# Patient Record
Sex: Female | Born: 2000 | Race: Black or African American | Hispanic: No | Marital: Single | Smoking: Never smoker
Health system: Southern US, Community
[De-identification: ages and names within clinical notes are randomized; demographics above are authoritative.]

## PROBLEM LIST (undated history)

## (undated) HISTORY — PX: TONSILLECTOMY: SUR1361

## (undated) HISTORY — PX: APPENDECTOMY: SHX54

## (undated) HISTORY — PX: NO PAST SURGERIES: SHX2092

---
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
## 2021-03-06 DIAGNOSIS — R300 Dysuria: Secondary | ICD-10-CM | POA: Diagnosis not present

## 2021-03-07 DIAGNOSIS — Z113 Encounter for screening for infections with a predominantly sexual mode of transmission: Secondary | ICD-10-CM | POA: Diagnosis not present

## 2021-03-07 DIAGNOSIS — J029 Acute pharyngitis, unspecified: Secondary | ICD-10-CM | POA: Diagnosis not present

## 2021-03-07 DIAGNOSIS — R300 Dysuria: Secondary | ICD-10-CM | POA: Diagnosis not present

## 2021-03-20 DIAGNOSIS — Z113 Encounter for screening for infections with a predominantly sexual mode of transmission: Secondary | ICD-10-CM | POA: Diagnosis not present

## 2021-03-20 DIAGNOSIS — A749 Chlamydial infection, unspecified: Secondary | ICD-10-CM | POA: Diagnosis not present

## 2021-04-22 DIAGNOSIS — Z113 Encounter for screening for infections with a predominantly sexual mode of transmission: Secondary | ICD-10-CM | POA: Diagnosis not present

## 2021-06-26 DIAGNOSIS — Z202 Contact with and (suspected) exposure to infections with a predominantly sexual mode of transmission: Secondary | ICD-10-CM | POA: Diagnosis not present

## 2021-06-26 DIAGNOSIS — Z719 Counseling, unspecified: Secondary | ICD-10-CM | POA: Diagnosis not present

## 2022-01-06 ENCOUNTER — Encounter (HOSPITAL_COMMUNITY): Payer: Self-pay | Admitting: Emergency Medicine

## 2022-01-06 ENCOUNTER — Emergency Department (HOSPITAL_COMMUNITY)
Admission: EM | Admit: 2022-01-06 | Discharge: 2022-01-06 | Discharge status: Against Medical Advice | Payer: Medicaid Other | Attending: Emergency Medicine | Admitting: Emergency Medicine

## 2022-01-06 DIAGNOSIS — R519 Headache, unspecified: Secondary | ICD-10-CM | POA: Insufficient documentation

## 2022-01-06 DIAGNOSIS — M79602 Pain in left arm: Secondary | ICD-10-CM | POA: Insufficient documentation

## 2022-01-06 DIAGNOSIS — Y990 Civilian activity done for income or pay: Secondary | ICD-10-CM | POA: Insufficient documentation

## 2022-01-06 DIAGNOSIS — Z5321 Procedure and treatment not carried out due to patient leaving prior to being seen by health care provider: Secondary | ICD-10-CM | POA: Diagnosis not present

## 2022-01-06 DIAGNOSIS — M542 Cervicalgia: Secondary | ICD-10-CM | POA: Insufficient documentation

## 2022-01-06 NOTE — ED Notes (Signed)
Visitor stated that pt work notified pt to go to another facility to be evaluated. Pt seen leaving ED.

## 2022-01-06 NOTE — ED Triage Notes (Signed)
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 Pt reports headache and left arm and neck pain.

## 2022-03-14 DIAGNOSIS — R8290 Unspecified abnormal findings in urine: Secondary | ICD-10-CM | POA: Diagnosis not present

## 2023-02-02 ENCOUNTER — Emergency Department (HOSPITAL_COMMUNITY): Payer: Medicaid Other

## 2023-02-02 ENCOUNTER — Encounter (HOSPITAL_COMMUNITY): Payer: Self-pay

## 2023-02-02 ENCOUNTER — Emergency Department (HOSPITAL_COMMUNITY)
Admission: EM | Admit: 2023-02-02 | Discharge: 2023-02-02 | Disposition: A | Discharge status: Home / Self Care | Payer: Medicaid Other | Attending: Emergency Medicine | Admitting: Emergency Medicine

## 2023-02-02 ENCOUNTER — Other Ambulatory Visit: Payer: Self-pay

## 2023-02-02 DIAGNOSIS — Y990 Civilian activity done for income or pay: Secondary | ICD-10-CM | POA: Insufficient documentation

## 2023-02-02 DIAGNOSIS — S46912A Strain of unspecified muscle, fascia and tendon at shoulder and upper arm level, left arm, initial encounter: Secondary | ICD-10-CM | POA: Diagnosis not present

## 2023-02-02 DIAGNOSIS — R200 Anesthesia of skin: Secondary | ICD-10-CM | POA: Diagnosis not present

## 2023-02-02 DIAGNOSIS — M7542 Impingement syndrome of left shoulder: Secondary | ICD-10-CM | POA: Insufficient documentation

## 2023-02-02 DIAGNOSIS — G5682 Other specified mononeuropathies of left upper limb: Secondary | ICD-10-CM

## 2023-02-02 DIAGNOSIS — R29818 Other symptoms and signs involving the nervous system: Secondary | ICD-10-CM | POA: Diagnosis not present

## 2023-02-02 DIAGNOSIS — M5412 Radiculopathy, cervical region: Secondary | ICD-10-CM | POA: Diagnosis not present

## 2023-02-02 DIAGNOSIS — M4312 Spondylolisthesis, cervical region: Secondary | ICD-10-CM | POA: Diagnosis not present

## 2023-02-02 DIAGNOSIS — X500XXA Overexertion from strenuous movement or load, initial encounter: Secondary | ICD-10-CM | POA: Diagnosis not present

## 2023-02-02 DIAGNOSIS — S4992XA Unspecified injury of left shoulder and upper arm, initial encounter: Secondary | ICD-10-CM | POA: Diagnosis present

## 2023-02-02 DIAGNOSIS — R531 Weakness: Secondary | ICD-10-CM | POA: Insufficient documentation

## 2023-02-02 LAB — CBC WITH DIFFERENTIAL/PLATELET
Abs Immature Granulocytes: 0.01 10*3/uL (ref 0.00–0.07)
Basophils Absolute: 0 10*3/uL (ref 0.0–0.1)
Basophils Relative: 1 %
Eosinophils Absolute: 0.1 10*3/uL (ref 0.0–0.5)
Eosinophils Relative: 1 %
HCT: 36 % (ref 36.0–46.0)
Hemoglobin: 10.9 g/dL — ABNORMAL LOW (ref 12.0–15.0)
Immature Granulocytes: 0 %
Lymphocytes Relative: 33 %
Lymphs Abs: 1.7 10*3/uL (ref 0.7–4.0)
MCH: 24.4 pg — ABNORMAL LOW (ref 26.0–34.0)
MCHC: 30.3 g/dL (ref 30.0–36.0)
MCV: 80.5 fL (ref 80.0–100.0)
Monocytes Absolute: 0.7 10*3/uL (ref 0.1–1.0)
Monocytes Relative: 13 %
Neutro Abs: 2.6 10*3/uL (ref 1.7–7.7)
Neutrophils Relative %: 52 %
Platelets: 289 10*3/uL (ref 150–400)
RBC: 4.47 MIL/uL (ref 3.87–5.11)
RDW: 15.1 % (ref 11.5–15.5)
WBC: 5 10*3/uL (ref 4.0–10.5)
nRBC: 0 % (ref 0.0–0.2)

## 2023-02-02 LAB — BASIC METABOLIC PANEL
Anion gap: 7 (ref 5–15)
BUN: 8 mg/dL (ref 6–20)
CO2: 24 mmol/L (ref 22–32)
Calcium: 9.2 mg/dL (ref 8.9–10.3)
Chloride: 103 mmol/L (ref 98–111)
Creatinine, Ser: 0.71 mg/dL (ref 0.44–1.00)
GFR, Estimated: >60 mL/min (ref >60)
Glucose, Bld: 109 mg/dL — ABNORMAL HIGH (ref 70–99)
Potassium: 3.2 mmol/L — ABNORMAL LOW (ref 3.5–5.1)
Sodium: 134 mmol/L — ABNORMAL LOW (ref 135–145)

## 2023-02-02 LAB — MAGNESIUM: Magnesium: 2.2 mg/dL (ref 1.7–2.4)

## 2023-02-02 MED ORDER — DEXAMETHASONE SODIUM PHOSPHATE 10 MG/ML IJ SOLN
10.0000 mg | Freq: Once | INTRAMUSCULAR | Status: AC
  Administered 2023-02-02: 10 mg via INTRAMUSCULAR
  Filled 2023-02-02: qty 1

## 2023-02-02 MED ORDER — IBUPROFEN 800 MG PO TABS: 800.0000 mg | ORAL_TABLET | Freq: Four times a day (QID) | ORAL | 0 refills | Status: DC | PRN

## 2023-02-02 MED ORDER — KETOROLAC TROMETHAMINE 60 MG/2ML IM SOLN
30.0000 mg | Freq: Once | INTRAMUSCULAR | Status: AC
  Administered 2023-02-02: 30 mg via INTRAMUSCULAR
  Filled 2023-02-02: qty 2

## 2023-02-02 MED ORDER — METHOCARBAMOL 500 MG PO TABS: 500.0000 mg | ORAL_TABLET | Freq: Three times a day (TID) | ORAL | 0 refills | Status: DC | PRN

## 2023-02-02 NOTE — ED Provider Triage Note (Signed)
Emergency Medicine Provider Triage Evaluation Note  Kimberly Choi , a 22 y.o. female  was evaluated in triage.  Pt complains of left upper extremity numbness, tingling and tremor.  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  States this happened to her approximately 1 year ago and was told that it was due to a nerve issue.  Reports the left side of her face feels numb as well.  Symptoms began at 3:30 PM yesterday  Review of Systems  Positive: As above Negative: As above  Physical Exam  BP (!) 153/85   Pulse 79   Temp 98.2 F (36.8 C) (Oral)   Resp 18   Ht 5\' 3"  (1.6 m)   Wt 59 kg   LMP 02/02/2023   SpO2 98%   BMI 23.03 kg/m  Gen:   Awake, no distress   Resp:  Normal effort  MSK:   Moves extremities without difficulty  Other:  No facial droop, full AROM of neck and bilateral upper extremities, left upper extremity tremor  Medical Decision Making  Medically screening exam initiated at 2:53 PM.  Appropriate orders placed.  Kimberly Choi was informed that the remainder of the evaluation will be completed by another provider, this initial triage assessment does not replace that evaluation, and the importance of remaining in the ED until their evaluation is complete.  Workup initiated   Michelle Piper, Cordelia Poche 02/02/23 1454

## 2023-02-02 NOTE — ED Provider Notes (Signed)
[place] EMERGENCY DEPARTMENT AT [facility] Provider Note   CSN: [id number] Arrival date & time: 02/02/23  1300     History  Chief Complaint  Patient presents with   Numbness    Kimberly Choi is a 22 y.o. female.  Patient presents to the emerged department for evaluation of numbness and weakness of her left arm.  Patient reports that symptoms began suddenly when she was lifting a patient at work yesterday.  Patient reports that this has happened before and she required physical therapy.  When it first happened she was having pain in the left side of her face as well but this has resolved.       Home Medications Prior to Admission medications   Medication Sig Start Date End Date Taking? Authorizing Provider  ibuprofen (ADVIL) 800 MG tablet Take 1 tablet (800 mg total) by mouth every 6 (six) hours as needed for moderate pain (pain score 4-6). 02/02/23  Yes Jleigh Striplin, Canary Brim, MD  methocarbamol (ROBAXIN) 500 MG tablet Take 1 tablet (500 mg total) by mouth every 8 (eight) hours as needed for muscle spasms. 02/02/23  Yes Wetzel Meester, Canary Brim, MD  amitriptyline (ELAVIL) 25 MG tablet Take 1 tablet (25 mg total) by mouth at bedtime. Take it 2 hours before sleep 07/01/18   Keturah Shavers, MD  cephALEXin (KEFLEX) 500 MG capsule Take 1 capsule (500 mg total) by mouth 4 (four) times daily. 11/08/18   Hedges, Tinnie Gens, PA-C  Magnesium Oxide 500 MG TABS Take 1 tablet (500 mg total) by mouth daily. 09/29/17   Keturah Shavers, MD  riboflavin (VITAMIN B-2) 100 MG TABS tablet Take 1 tablet (100 mg total) by mouth daily. Patient not taking: Reported on 07/01/2018 09/29/17   Keturah Shavers, MD  tiZANidine (ZANAFLEX) 2 MG tablet Take 1 tablet (2 mg total) by mouth daily with breakfast. 07/01/18   Keturah Shavers, MD      Allergies    Patient has no known allergies.    Review of Systems   Review of Systems  Physical Exam Updated Vital Signs BP 127/76   Pulse 72   Temp 98.3  F (36.8 C) (Oral)   Resp 17   Ht 5\' 3"  (1.6 m)   Wt 59 kg   LMP 02/02/2023   SpO2 100%   BMI 23.03 kg/m  Physical Exam Vitals and nursing note reviewed.  Constitutional:      General: She is not in acute distress.    Appearance: She is well-developed.  HENT:     Head: Normocephalic and atraumatic.     Mouth/Throat:     Mouth: Mucous membranes are moist.  Eyes:     General: Vision grossly intact. Gaze aligned appropriately.     Extraocular Movements: Extraocular movements intact.     Conjunctiva/sclera: Conjunctivae normal.  Cardiovascular:     Rate and Rhythm: Normal rate and regular rhythm.     Pulses: Normal pulses.     Heart sounds: Normal heart sounds, S1 normal and S2 normal. No murmur heard.    No friction rub. No gallop.  Pulmonary:     Effort: Pulmonary effort is normal. No respiratory distress.     Breath sounds: Normal breath sounds.  Abdominal:     General: Bowel sounds are normal.     Palpations: Abdomen is soft.     Tenderness: There is no abdominal tenderness. There is no guarding or rebound.     Hernia: No hernia is present.  Musculoskeletal:  General: No swelling.     Left shoulder: Tenderness present.       Arms:     Cervical back: Full passive range of motion without pain, normal range of motion and neck supple. No spinous process tenderness or muscular tenderness. Normal range of motion.     Right lower leg: No edema.     Left lower leg: No edema.     Comments: Significant trapezius tenderness and muscle spasm on exam  Skin:    General: Skin is warm and dry.     Capillary Refill: Capillary refill takes less than 2 seconds.     Findings: No ecchymosis, erythema, rash or wound.  Neurological:     General: No focal deficit present.     Mental Status: She is alert and oriented to person, place, and time.     GCS: GCS eye subscore is 4. GCS verbal subscore is 5. GCS motor subscore is 6.     Cranial Nerves: Cranial nerves 2-12 are intact.      Sensory: Sensation is intact.     Motor: Motor function is intact.     Coordination: Coordination is intact.     Comments: No obvious neurologic deficit, patient does have some painful inhibition with proximal movements of the left arm secondary to pain in the shoulder and trapezius area, distal strength appears normal  Psychiatric:        Attention and Perception: Attention normal.        Mood and Affect: Mood normal.        Speech: Speech normal.        Behavior: Behavior normal.     ED Results / Procedures / Treatments   Labs (all labs ordered are listed, but only abnormal results are displayed) Labs Reviewed  BASIC METABOLIC PANEL - Abnormal; Notable for the following components:      Result Value   Sodium 134 (*)    Potassium 3.2 (*)    Glucose, Bld 109 (*)    All other components within normal limits  CBC WITH DIFFERENTIAL/PLATELET - Abnormal; Notable for the following components:   Hemoglobin 10.9 (*)    MCH 24.4 (*)    All other components within normal limits  MAGNESIUM    EKG None  Radiology CT Head Wo Contrast Result Date: 02/02/2023 CLINICAL DATA:  Provided history: Neuro deficit, acute, stroke suspected. Cervical radiculopathy, no red flags. Additional history obtained from electronic MEDICAL RECORD NUMBERnumbness/tingling in left side of face and arm, decreased strength in left arm. EXAM: CT HEAD WITHOUT CONTRAST CT CERVICAL SPINE WITHOUT CONTRAST TECHNIQUE: Multidetector CT imaging of the head and cervical spine was performed following the standard protocol without intravenous contrast. Multiplanar CT image reconstructions of the cervical spine were also generated. RADIATION DOSE REDUCTION: This exam was performed according to the departmental dose-optimization program which includes automated exposure control, adjustment of the mA and/or kV according to patient size and/or use of iterative reconstruction technique. COMPARISON:  Cervical spine radiographs 07/19/2005.  FINDINGS: CT HEAD FINDINGS Streak/beam hardening artifact arising from earrings and hair clips obscures portions of the brain bilaterally. Within this limitation, findings are as follows. Brain: Cerebral volume is normal. There is no acute intracranial hemorrhage. No demarcated cortical infarct. No extra-axial fluid collection. No evidence of an intracranial mass. No midline shift. Vascular: No hyperdense vessel. Skull: No calvarial fracture or aggressive osseous lesion. Sinuses/Orbits: No mass or acute finding within the imaged orbits. No significant paranasal sinus disease. CT CERVICAL SPINE FINDINGS Alignment:  Nonspecific reversal of the expected cervical lordosis. Slight grade 1 anterolisthesis at C2-C3 and C3-C4. Skull base and vertebrae: The basion-dental and atlanto-dental intervals are maintained. No evidence of a cervical spine fracture. Soft tissues and spinal canal: No prevertebral fluid or swelling. No visible canal hematoma. Disc levels: Intervertebral disc height is preserved throughout the cervical spine. C2-C3: Slight grade 1 anterolisthesis. No significant disc herniation or spinal canal stenosis appreciated. No significant bony neural foraminal narrowing. C3-C4: Slight grade 1 anterolisthesis. No significant disc herniation or spinal canal stenosis appreciated. No significant bony neural foraminal narrowing. C4-C5: No significant disc herniation or spinal canal stenosis appreciated. No significant bony neural foraminal narrowing. C5-C6: No significant disc herniation or spinal canal stenosis appreciated. No significant bony neural foraminal narrowing. C6-C7: No significant disc herniation or spinal canal stenosis appreciated. No significant bony neural foraminal narrowing. C7-T1: No significant disc herniation or spinal canal stenosis appreciated. No significant bony neural foraminal narrowing. Upper chest: No consolidation within the imaged lung apices. IMPRESSION: CT head: 1. Streak/beam  hardening artifact arising from earrings and hair clips obscures portions of the brain bilaterally. 2. Within this limitation, there is no evidence of an acute intracranial abnormality. CT cervical spine: 1. No significant disc herniation or spinal canal stenosis appreciated. No significant bony neural foraminal narrowing. 2. Nonspecific reversal of the expected cervical lordosis. 3. Mild grade 1 anterolisthesis at C2-C3 and C3-C4. Electronically Signed   By: Jackey Loge D.O.   On: 02/02/2023 16:08   CT Cervical Spine Wo Contrast Result Date: 02/02/2023 CLINICAL DATA:  Provided history: Neuro deficit, acute, stroke suspected. Cervical radiculopathy, no red flags. Additional history obtained from electronic MEDICAL RECORD NUMBERnumbness/tingling in left side of face and arm, decreased strength in left arm. EXAM: CT HEAD WITHOUT CONTRAST CT CERVICAL SPINE WITHOUT CONTRAST TECHNIQUE: Multidetector CT imaging of the head and cervical spine was performed following the standard protocol without intravenous contrast. Multiplanar CT image reconstructions of the cervical spine were also generated. RADIATION DOSE REDUCTION: This exam was performed according to the departmental dose-optimization program which includes automated exposure control, adjustment of the mA and/or kV according to patient size and/or use of iterative reconstruction technique. COMPARISON:  Cervical spine radiographs 07/19/2005. FINDINGS: CT HEAD FINDINGS Streak/beam hardening artifact arising from earrings and hair clips obscures portions of the brain bilaterally. Within this limitation, findings are as follows. Brain: Cerebral volume is normal. There is no acute intracranial hemorrhage. No demarcated cortical infarct. No extra-axial fluid collection. No evidence of an intracranial mass. No midline shift. Vascular: No hyperdense vessel. Skull: No calvarial fracture or aggressive osseous lesion. Sinuses/Orbits: No mass or acute finding within the imaged  orbits. No significant paranasal sinus disease. CT CERVICAL SPINE FINDINGS Alignment: Nonspecific reversal of the expected cervical lordosis. Slight grade 1 anterolisthesis at C2-C3 and C3-C4. Skull base and vertebrae: The basion-dental and atlanto-dental intervals are maintained. No evidence of a cervical spine fracture. Soft tissues and spinal canal: No prevertebral fluid or swelling. No visible canal hematoma. Disc levels: Intervertebral disc height is preserved throughout the cervical spine. C2-C3: Slight grade 1 anterolisthesis. No significant disc herniation or spinal canal stenosis appreciated. No significant bony neural foraminal narrowing. C3-C4: Slight grade 1 anterolisthesis. No significant disc herniation or spinal canal stenosis appreciated. No significant bony neural foraminal narrowing. C4-C5: No significant disc herniation or spinal canal stenosis appreciated. No significant bony neural foraminal narrowing. C5-C6: No significant disc herniation or spinal canal stenosis appreciated. No significant bony neural foraminal narrowing. C6-C7: No significant disc  herniation or spinal canal stenosis appreciated. No significant bony neural foraminal narrowing. C7-T1: No significant disc herniation or spinal canal stenosis appreciated. No significant bony neural foraminal narrowing. Upper chest: No consolidation within the imaged lung apices. IMPRESSION: CT head: 1. Streak/beam hardening artifact arising from earrings and hair clips obscures portions of the brain bilaterally. 2. Within this limitation, there is no evidence of an acute intracranial abnormality. CT cervical spine: 1. No significant disc herniation or spinal canal stenosis appreciated. No significant bony neural foraminal narrowing. 2. Nonspecific reversal of the expected cervical lordosis. 3. Mild grade 1 anterolisthesis at C2-C3 and C3-C4. Electronically Signed   By: Jackey Loge D.O.   On: 02/02/2023 16:08    Procedures Procedures     Medications Ordered in ED Medications  ketorolac (TORADOL) injection 30 mg (has no administration in time range)  dexamethasone (DECADRON) injection 10 mg (has no administration in time range)    ED Course/ Medical Decision Making/ A&P                                 Medical Decision Making  Patient presents with sudden onset of pain, numbness, weakness of the left upper extremity after an injury.  Patient was performing a lifting action when she felt sudden pain in the left side of her neck, trapezius and upper back, shoulder area.  Patient reports that she continues to have pain, the entire area continues to intermittently shake.  On examination there is significant soft tissue tenderness in the area of the trapezius and painful and admission of movement of the left upper extremity without obvious neurologic deficit.  CT does not show any significant abnormality.  No concern for stroke.        Final Clinical Impression(s) / ED Diagnoses Final diagnoses:  Shoulder strain, left, initial encounter  Pinched nerve in shoulder, left    Rx / DC Orders ED Discharge Orders          Ordered    ibuprofen (ADVIL) 800 MG tablet  Every 6 hours PRN        02/02/23 2341    methocarbamol (ROBAXIN) 500 MG tablet  Every 8 hours PRN        02/02/23 2341              Gilda Crease, MD 02/02/23 2341

## 2023-02-02 NOTE — ED Triage Notes (Signed)
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 Following this she felt numbness and tingling on the left side of her face and arm. Pt states this has happened to her before and was related to a pinched nerve. Noted to have decreased strength in the left arm, but no other S/S of stroke at this time.

## 2023-06-19 ENCOUNTER — Telehealth: Payer: Self-pay | Admitting: Family Medicine

## 2023-06-19 NOTE — Telephone Encounter (Signed)
 Copied from CRM [id number]. Topic: General - Other >> Jun 19, 2023  9:30 AM Zipporah Him wrote: Reason for CRM: Patient states that her medical records should be faxed over soon to the office from her previous provider.

## 2023-06-24 ENCOUNTER — Ambulatory Visit (INDEPENDENT_AMBULATORY_CARE_PROVIDER_SITE_OTHER): Admitting: Family Medicine

## 2023-06-24 ENCOUNTER — Encounter: Payer: Self-pay | Admitting: Family Medicine

## 2023-06-24 VITALS — BP 115/72 | HR 70 | Resp 16 | Ht 63.0 in | Wt 135.0 lb

## 2023-06-24 DIAGNOSIS — E782 Mixed hyperlipidemia: Secondary | ICD-10-CM | POA: Diagnosis not present

## 2023-06-24 DIAGNOSIS — E559 Vitamin D deficiency, unspecified: Secondary | ICD-10-CM

## 2023-06-24 DIAGNOSIS — Z114 Encounter for screening for human immunodeficiency virus [HIV]: Secondary | ICD-10-CM | POA: Diagnosis not present

## 2023-06-24 DIAGNOSIS — Z23 Encounter for immunization: Secondary | ICD-10-CM

## 2023-06-24 DIAGNOSIS — Z7185 Encounter for immunization safety counseling: Secondary | ICD-10-CM

## 2023-06-24 DIAGNOSIS — E038 Other specified hypothyroidism: Secondary | ICD-10-CM

## 2023-06-24 DIAGNOSIS — Z1159 Encounter for screening for other viral diseases: Secondary | ICD-10-CM

## 2023-06-24 DIAGNOSIS — Z0001 Encounter for general adult medical examination with abnormal findings: Secondary | ICD-10-CM | POA: Diagnosis not present

## 2023-06-24 DIAGNOSIS — A150 Tuberculosis of lung: Secondary | ICD-10-CM

## 2023-06-24 DIAGNOSIS — R7301 Impaired fasting glucose: Secondary | ICD-10-CM

## 2023-06-24 NOTE — Progress Notes (Signed)
 Complete physical exam  Patient: Kimberly Choi   DOB: [birth year]   23 y.o. 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     Kimberly Choi is a 23 y.o. female who presents today for a complete physical exam. She reports consuming a general diet. Gym/ health club routine includes cardio and Lifting weights twice per week. She generally feels well. She reports sleeping well. She does have additional problems to discuss today.    Most recent fall risk assessment:    06/24/2023   11:00 AM  Fall Risk   Falls in the past year? 0  Number falls in past yr: 0  Injury with Fall? 0  Follow up Falls evaluation completed     Most recent depression screenings:    06/24/2023   11:00 AM  PHQ 2/9 Scores  PHQ - 2 Score 1    Vision:Not within last year  and Dental: No current dental problems and Receives regular dental care  Patient Care Team: Hilma Lucks, MD as PCP - General (Pediatrics)   Outpatient Medications Prior to Visit  Medication Sig   [DISCONTINUED] amitriptyline  (ELAVIL ) 25 MG tablet Take 1 tablet (25 mg total) by mouth at bedtime. Take it 2 hours before sleep (Patient not taking: Reported on 06/24/2023)   [DISCONTINUED] cephALEXin  (KEFLEX ) 500 MG capsule Take 1 capsule (500 mg total) by mouth 4 (four) times daily. (Patient not taking: Reported on 06/24/2023)   [DISCONTINUED] ibuprofen  (ADVIL ) 800 MG tablet Take 1 tablet (800 mg total) by mouth every 6 (six) hours as needed for moderate pain (pain score 4-6). (Patient not taking: Reported on 06/24/2023)   [DISCONTINUED] Magnesium  Oxide 500 MG TABS Take 1 tablet (500 mg total) by mouth daily. (Patient not taking: Reported on 06/24/2023)   [DISCONTINUED] methocarbamol  (ROBAXIN ) 500 MG tablet Take 1 tablet (500 mg total) by mouth every 8 (eight) hours as needed for muscle  spasms. (Patient not taking: Reported on 06/24/2023)   [DISCONTINUED] riboflavin (VITAMIN B-2) 100 MG TABS tablet Take 1 tablet (100 mg total) by mouth daily. (Patient not taking: Reported on 06/24/2023)   [DISCONTINUED] tiZANidine  (ZANAFLEX ) 2 MG tablet Take 1 tablet (2 mg total) by mouth daily with breakfast. (Patient not taking: Reported on 06/24/2023)   No facility-administered medications prior to visit.    Review of Systems  Constitutional:  Negative for chills and fever.  HENT:  Negative for ear pain.   Eyes:  Negative for blurred vision and pain.  Respiratory:  Negative for cough.   Cardiovascular:  Negative for chest pain.  Gastrointestinal:  Negative for abdominal pain.  Genitourinary:  Negative for dysuria.  Musculoskeletal:  Negative for myalgias.  Skin:  Negative for rash.  Neurological:  Negative for dizziness and headaches.  Psychiatric/Behavioral:  The patient is not nervous/anxious.        Objective:    BP 115/72   Pulse 70   Resp 16   Ht 5\' 3"  (1.6 m)   Wt 135 lb (61.2 kg)   SpO2 99%   BMI 23.91 kg/m  BP Readings from Last 3 Encounters:  06/24/23 115/72  02/02/23 127/76  01/06/22 123/86      Physical Exam Vitals reviewed.  Constitutional:      General: She is not in acute distress.    Appearance: Normal appearance.  She is not ill-appearing, toxic-appearing or diaphoretic.  HENT:     Head: Normocephalic.     Right Ear: Tympanic membrane normal.     Left Ear: Tympanic membrane normal.     Nose: Nose normal.     Mouth/Throat:     Mouth: Mucous membranes are moist.  Eyes:     General:        Right eye: No discharge.        Left eye: No discharge.     Extraocular Movements: Extraocular movements intact.     Conjunctiva/sclera: Conjunctivae normal.  Cardiovascular:     Rate and Rhythm: Normal rate.     Pulses: Normal pulses.     Heart sounds: Normal heart sounds.  Pulmonary:     Effort: Pulmonary effort is normal. No respiratory distress.      Breath sounds: Normal breath sounds.  Abdominal:     General: Bowel sounds are normal.     Palpations: Abdomen is soft.     Tenderness: There is no abdominal tenderness. There is no right CVA tenderness, left CVA tenderness or guarding.  Musculoskeletal:        General: Normal range of motion.     Cervical back: Normal range of motion.  Skin:    General: Skin is warm and dry.  Neurological:     Mental Status: She is alert.     Coordination: Coordination normal.     Gait: Gait normal.  Psychiatric:        Mood and Affect: Mood normal.        Behavior: Behavior normal.      No results found for any visits on 06/24/23.    Assessment & Plan:    Routine Health Maintenance and Physical Exam   There is no immunization history on file for this patient.  Health Maintenance  Topic Date Due   HPV VACCINES (1 - 3-dose series) Never done   HIV Screening  Never done   Meningococcal B Vaccine (1 of 2 - Standard) Never done   Hepatitis C Screening  Never done   DTaP/Tdap/Td (1 - Tdap) Never done   Cervical Cancer Screening (Pap smear)  Never done   COVID-19 Vaccine (1 - 2024-25 season) Never done   INFLUENZA VACCINE  09/04/2023    Discussed health benefits of physical activity, and encouraged her to engage in regular exercise appropriate for her age and condition.  Vaccine counseling -     Measles/Mumps/Rubella Immunity -     Hepatitis B surface antibody,quantitative -     Varicella zoster antibody, IgG  Need for hepatitis C screening test -     Hepatitis C antibody  Vitamin D deficiency -     VITAMIN D 25 Hydroxy (Vit-D Deficiency, Fractures)  Mixed hyperlipidemia -     Lipid panel -     CMP14+EGFR -     CBC with Differential/Platelet  Encounter for screening for HIV -     HIV Antibody (routine testing w rflx)  IFG (impaired fasting glucose) -     Hemoglobin A1c  TSH (thyroid-stimulating hormone deficiency) -     TSH + free T4  TB (pulmonary tuberculosis) -      QuantiFERON-TB Gold Plus  Encounter for routine adult physical exam with abnormal findings Assessment & Plan: A comprehensive physical examination was completed, and necessary labs were ordered. Screening and health maintenance recommendations have been updated. The patient received counseling on exercise and nutrition. BMI was assessed and discussed Advise  for heart health, focus on: Eat more fruits and vegetables: Aim for a variety of colors. Choose whole grains: Brown rice, oats, and whole-wheat bread. Limit unhealthy fats: Avoid trans fats; use olive or avocado oil instead. Include lean proteins: Opt for fish, chicken, beans, and legumes. Reduce sodium: Limit processed foods and add less salt. Stay hydrated: Drink plenty of water. Exercise regularly: Aim for at least 30 minutes of moderate exercise, like walking or cycling, 5 days a week.       Return in about 12 weeks (around 09/16/2023), or if symptoms worsen or fail to improve, for Pap smear.     Avelino Lek Amber Bail, FNP

## 2023-06-24 NOTE — Patient Instructions (Signed)

## 2023-06-24 NOTE — Assessment & Plan Note (Signed)

## 2023-06-25 ENCOUNTER — Ambulatory Visit: Payer: Self-pay | Admitting: Family Medicine

## 2023-06-28 LAB — TSH+FREE T4
Free T4: 1.1 ng/dL (ref 0.82–1.77)
TSH: 1.34 u[IU]/mL (ref 0.450–4.500)

## 2023-06-28 LAB — CBC WITH DIFFERENTIAL/PLATELET
Basophils Absolute: 0 10*3/uL (ref 0.0–0.2)
Basos: 0 %
EOS (ABSOLUTE): 0.1 10*3/uL (ref 0.0–0.4)
Eos: 2 %
Hematocrit: 35.8 % (ref 34.0–46.6)
Hemoglobin: 10.9 g/dL — ABNORMAL LOW (ref 11.1–15.9)
Immature Grans (Abs): 0 10*3/uL (ref 0.0–0.1)
Immature Granulocytes: 0 %
Lymphocytes Absolute: 1.4 10*3/uL (ref 0.7–3.1)
Lymphs: 32 %
MCH: 24.6 pg — ABNORMAL LOW (ref 26.6–33.0)
MCHC: 30.4 g/dL — ABNORMAL LOW (ref 31.5–35.7)
MCV: 81 fL (ref 79–97)
Monocytes Absolute: 0.4 10*3/uL (ref 0.1–0.9)
Monocytes: 9 %
Neutrophils Absolute: 2.5 10*3/uL (ref 1.4–7.0)
Neutrophils: 57 %
Platelets: 319 10*3/uL (ref 150–450)
RBC: 4.43 x10E6/uL (ref 3.77–5.28)
RDW: 14.8 % (ref 11.7–15.4)
WBC: 4.5 10*3/uL (ref 3.4–10.8)

## 2023-06-28 LAB — HIV ANTIBODY (ROUTINE TESTING W REFLEX): HIV Screen 4th Generation wRfx: NONREACTIVE

## 2023-06-28 LAB — QUANTIFERON-TB GOLD PLUS
QuantiFERON Nil Value: 0.05 [IU]/mL
QuantiFERON TB1 Ag Value: 0.1 [IU]/mL
QuantiFERON TB2 Ag Value: 0.09 [IU]/mL

## 2023-06-28 LAB — HEMOGLOBIN A1C
Est. average glucose Bld gHb Est-mCnc: 100 mg/dL
Hgb A1c MFr Bld: 5.1 % (ref 4.8–5.6)

## 2023-06-28 LAB — LIPID PANEL
Chol/HDL Ratio: 2.1 ratio (ref 0.0–4.4)
Cholesterol, Total: 153 mg/dL (ref 100–199)
HDL: 74 mg/dL (ref >39)
LDL Chol Calc (NIH): 69 mg/dL (ref 0–99)
Triglycerides: 47 mg/dL (ref 0–149)
VLDL Cholesterol Cal: 10 mg/dL (ref 5–40)

## 2023-06-28 LAB — MEASLES/MUMPS/RUBELLA IMMUNITY
MUMPS ABS, IGG: 144 [AU]/ml (ref >10.9)
RUBEOLA AB, IGG: >300 [AU]/ml (ref >16.4)
Rubella Antibodies, IGG: 5.03 {index} (ref >0.99)

## 2023-06-28 LAB — VITAMIN D 25 HYDROXY (VIT D DEFICIENCY, FRACTURES): Vit D, 25-Hydroxy: 34.1 ng/mL (ref 30.0–100.0)

## 2023-06-28 LAB — HEPATITIS C ANTIBODY: Hep C Virus Ab: NONREACTIVE

## 2023-06-28 LAB — CMP14+EGFR
ALT: 10 IU/L (ref 0–32)
AST: 15 IU/L (ref 0–40)
Albumin: 4.2 g/dL (ref 4.0–5.0)
Alkaline Phosphatase: 51 IU/L (ref 44–121)
BUN/Creatinine Ratio: 11 (ref 9–23)
BUN: 9 mg/dL (ref 6–20)
Bilirubin Total: 0.4 mg/dL (ref 0.0–1.2)
CO2: 22 mmol/L (ref 20–29)
Calcium: 9.6 mg/dL (ref 8.7–10.2)
Chloride: 103 mmol/L (ref 96–106)
Creatinine, Ser: 0.8 mg/dL (ref 0.57–1.00)
Globulin, Total: 2.9 g/dL (ref 1.5–4.5)
Glucose: 76 mg/dL (ref 70–99)
Potassium: 4.5 mmol/L (ref 3.5–5.2)
Sodium: 139 mmol/L (ref 134–144)
Total Protein: 7.1 g/dL (ref 6.0–8.5)
eGFR: 106 mL/min/{1.73_m2} (ref >59)

## 2023-06-28 LAB — VARICELLA ZOSTER ANTIBODY, IGG: Varicella zoster IgG: REACTIVE

## 2023-06-30 ENCOUNTER — Telehealth: Payer: Self-pay | Admitting: Family Medicine

## 2023-06-30 ENCOUNTER — Other Ambulatory Visit: Payer: Self-pay | Admitting: Family Medicine

## 2023-06-30 DIAGNOSIS — Z0001 Encounter for general adult medical examination with abnormal findings: Secondary | ICD-10-CM

## 2023-06-30 NOTE — Telephone Encounter (Signed)
 Please advise  Copied from CRM [id number]. Topic: General - Other >> Jun 30, 2023 10:16 AM Turkey B wrote: Reason for CRM: pt called in checking status if Dr Lora Robinsons, received a physical form and immunization for and hep b form. Please cb

## 2023-06-30 NOTE — Telephone Encounter (Signed)
 Kimberly Choi has received form , awaiting hep b blood work results

## 2023-07-01 ENCOUNTER — Other Ambulatory Visit: Payer: Self-pay | Admitting: Family Medicine

## 2023-07-01 LAB — SPECIMEN STATUS REPORT

## 2023-07-01 LAB — HEPATITIS B SURFACE ANTIBODY, QUANTITATIVE: Hepatitis B Surf Ab Quant: 21 m[IU]/mL

## 2023-07-01 NOTE — Telephone Encounter (Signed)
 Let her know her school form is complete and she can come pick it up, thanks

## 2023-07-01 NOTE — Telephone Encounter (Signed)
 Form printed for documentation

## 2023-07-01 NOTE — Telephone Encounter (Signed)
Pt informed

## 2023-07-30 ENCOUNTER — Ambulatory Visit: Admitting: Family Medicine

## 2023-11-05 DIAGNOSIS — Z23 Encounter for immunization: Secondary | ICD-10-CM | POA: Diagnosis not present

## 2024-03-07 ENCOUNTER — Ambulatory Visit: Payer: Self-pay

## 2024-03-07 NOTE — Telephone Encounter (Signed)
 FYI Only or Action Required?: Action required by provider: request for appointment.  Patient was last seen in primary care on 06/24/2023 by Terry Wilhelmena Lloyd Hilario, FNP.  Called Nurse Triage reporting Neurologic Problem.  Symptoms began several weeks ago.  Interventions attempted: Rest, hydration, or home remedies.  Symptoms are: gradually worsening.  Triage Disposition: See HCP Within 4 Hours (Or PCP Triage)  Patient/caregiver understands and will follow disposition?: No, wishes to speak with PCP    Pain, tingling, and numbness in right arm and neck pt has been diagnosed with Thoracic outlet syndrome on her left side and is worried it may be compressing the right side. Pt woul like to schedule an appointment. Her pcp is on leave   Reason for Disposition  Neck pain (and neurologic deficit)  Answer Assessment - Initial Assessment Questions Pt  thinks pain/ numbness is from previous dx toracic outlet syndrome, declines UC/ ED recommendation. Advised office will call back when open  1. SYMPTOM: What is the main symptom you are concerned about? (e.g., weakness, numbness)     Numbness pain in rt arm 2. ONSET: When did this start? (e.g., minutes, hours, days; while sleeping)     Several weeks ago 3. LAST NORMAL: When was the last time you (the patient) were normal (no symptoms)?     Before accident at work 4. PATTERN Does this come and go, or has it been constant since it started?  Is it present now?     constant 5. CARDIAC SYMPTOMS: Have you had any of the following symptoms: chest pain, difficulty breathing, palpitations?     no 6. NEUROLOGIC SYMPTOMS: Have you had any of the following symptoms: headache, dizziness, vision loss, double vision, changes in speech, unsteady on your feet?     States has neck pain due to sleeping wrong.  Protocols used: Neurologic Deficit-A-AH

## 2024-03-10 ENCOUNTER — Other Ambulatory Visit: Payer: Self-pay

## 2024-03-10 DIAGNOSIS — R0789 Other chest pain: Secondary | ICD-10-CM

## 2024-03-10 NOTE — Telephone Encounter (Signed)
 I have placed an order for her to get an x-ray of right side ribs/chest
